# Patient Record
Sex: Male | Born: 1965 | Race: White | Hispanic: No | Marital: Married | Smoking: Former smoker
Health system: Southern US, Community
[De-identification: ages and names within clinical notes are randomized; demographics above are authoritative.]

## PROBLEM LIST (undated history)

## (undated) DIAGNOSIS — I10 Essential (primary) hypertension: Secondary | ICD-10-CM

---

## 2004-10-23 ENCOUNTER — Emergency Department: Payer: Self-pay | Admitting: Internal Medicine

## 2016-11-26 ENCOUNTER — Other Ambulatory Visit (HOSPITAL_COMMUNITY): Payer: Self-pay | Admitting: Physician Assistant

## 2016-11-26 DIAGNOSIS — R05 Cough: Secondary | ICD-10-CM

## 2016-11-26 DIAGNOSIS — R059 Cough, unspecified: Secondary | ICD-10-CM

## 2016-11-26 DIAGNOSIS — R55 Syncope and collapse: Secondary | ICD-10-CM

## 2016-12-19 ENCOUNTER — Telehealth: Payer: Self-pay | Admitting: Radiology

## 2017-05-29 ENCOUNTER — Ambulatory Visit: Payer: BLUE CROSS/BLUE SHIELD | Attending: Neurology

## 2017-05-29 DIAGNOSIS — G4733 Obstructive sleep apnea (adult) (pediatric): Secondary | ICD-10-CM | POA: Insufficient documentation

## 2017-05-29 DIAGNOSIS — R0683 Snoring: Secondary | ICD-10-CM | POA: Insufficient documentation

## 2017-10-05 ENCOUNTER — Emergency Department (HOSPITAL_COMMUNITY): Payer: BLUE CROSS/BLUE SHIELD

## 2017-10-05 ENCOUNTER — Emergency Department (HOSPITAL_COMMUNITY)
Admission: EM | Admit: 2017-10-05 | Discharge: 2017-10-06 | Disposition: A | Discharge status: Home / Self Care | Payer: BLUE CROSS/BLUE SHIELD | Attending: Emergency Medicine | Admitting: Emergency Medicine

## 2017-10-05 ENCOUNTER — Encounter (HOSPITAL_COMMUNITY): Payer: Self-pay | Admitting: *Deleted

## 2017-10-05 DIAGNOSIS — I10 Essential (primary) hypertension: Secondary | ICD-10-CM | POA: Diagnosis not present

## 2017-10-05 DIAGNOSIS — R0789 Other chest pain: Secondary | ICD-10-CM | POA: Insufficient documentation

## 2017-10-05 DIAGNOSIS — Z87891 Personal history of nicotine dependence: Secondary | ICD-10-CM | POA: Insufficient documentation

## 2017-10-05 HISTORY — DX: Essential (primary) hypertension: I10

## 2017-10-05 MED ORDER — GI COCKTAIL ~~LOC~~
30.0000 mL | Freq: Once | ORAL | Status: AC
  Administered 2017-10-06: 30 mL via ORAL
  Filled 2017-10-05: qty 30

## 2017-10-05 NOTE — ED Notes (Signed)
EKG done upon arrival to room. EKG given to and seen by Dr Manus Gunningancour.

## 2017-10-05 NOTE — ED Triage Notes (Signed)
Pt c/o chest tightness that started this am when he woke up; pt describes the pain at a tightness and has some sob with it; pt denies any radiation

## 2017-10-06 ENCOUNTER — Emergency Department (HOSPITAL_COMMUNITY): Payer: BLUE CROSS/BLUE SHIELD

## 2017-10-06 LAB — D-DIMER, QUANTITATIVE (NOT AT ARMC): D DIMER QUANT: 0.66 ug{FEU}/mL — AB (ref 0.00–0.50)

## 2017-10-06 LAB — CBC
HEMATOCRIT: 42.8 % (ref 39.0–52.0)
HEMOGLOBIN: 14.5 g/dL (ref 13.0–17.0)
MCH: 32.4 pg (ref 26.0–34.0)
MCHC: 33.9 g/dL (ref 30.0–36.0)
MCV: 95.7 fL (ref 78.0–100.0)
Platelets: 237 10*3/uL (ref 150–400)
RBC: 4.47 MIL/uL (ref 4.22–5.81)
RDW: 13.3 % (ref 11.5–15.5)
WBC: 6.6 10*3/uL (ref 4.0–10.5)

## 2017-10-06 LAB — BASIC METABOLIC PANEL
ANION GAP: 9 (ref 5–15)
BUN: 16 mg/dL (ref 6–20)
CALCIUM: 9.2 mg/dL (ref 8.9–10.3)
CO2: 25 mmol/L (ref 22–32)
Chloride: 102 mmol/L (ref 101–111)
Creatinine, Ser: 1.1 mg/dL (ref 0.61–1.24)
GFR calc Af Amer: >60 mL/min (ref >60)
GLUCOSE: 113 mg/dL — AB (ref 65–99)
POTASSIUM: 3.9 mmol/L (ref 3.5–5.1)
SODIUM: 136 mmol/L (ref 135–145)

## 2017-10-06 LAB — I-STAT TROPONIN, ED
TROPONIN I, POC: 0 ng/mL (ref 0.00–0.08)
Troponin i, poc: 0 ng/mL (ref 0.00–0.08)

## 2017-10-06 MED ORDER — IOPAMIDOL (ISOVUE-370) INJECTION 76%
100.0000 mL | Freq: Once | INTRAVENOUS | Status: AC | PRN
  Administered 2017-10-06: 100 mL via INTRAVENOUS

## 2017-10-06 MED ORDER — OMEPRAZOLE 20 MG PO CPDR: 20.0000 mg | DELAYED_RELEASE_CAPSULE | Freq: Every day | ORAL | 0 refills | Status: AC

## 2017-10-06 NOTE — Discharge Instructions (Signed)
There is no evidence of heart attack or blood clot in the lung.  Take the stomach medication as prescribed.  Follow-up with the cardiologist for stress test.  Return to the ED if your chest pain becomes worse, is associated with exertion, is associated with shortness of breath, nausea, sweating or any other concerns.

## 2017-10-06 NOTE — ED Notes (Signed)
I-Stat Troponin: 0.00 

## 2017-10-06 NOTE — ED Provider Notes (Signed)
[facility] EMERGENCY DEPARTMENT Provider Note   CSN: [id number] Arrival date & time: 10/05/17  2327     History   Chief Complaint Chief Complaint  Patient presents with  . Chest Pain    HPI Alexander Marks is a 51 y.o. male.  Patient presents with constant left-sided chest tightness that started this morning about 8:30 AM after he ate a bacon and egg sandwich.  He was up for several hours before he had chest tightness.  Tightness has been persistent all day and is constant and does not wax or wane in severity.  Denies any shortness of breath worse than his baseline.  Denies any nausea or vomiting.  Denies radiation of the pain.  Denies any syncope or lightheadedness.  Denies abdominal pain.  Reports no cardiac history.  Takes medication for blood pressure only.  A previous smoker many years ago.  He has never had a stress test.  Reports he had chest tightness like this when he was diagnosed with high blood pressure.   The history is provided by the patient.  Chest Pain   Associated symptoms include shortness of breath. Pertinent negatives include no abdominal pain, no dizziness, no fever, no headaches, no nausea, no vomiting and no weakness.    Past Medical History:  Diagnosis Date  . Hypertension     There are no active problems to display for this patient.   History reviewed. No pertinent surgical history.     Home Medications    Prior to Admission medications   Not on File    Family History History reviewed. No pertinent family history.  Social History Social History  Substance Use Topics  . Smoking status: Former Games developer  . Smokeless tobacco: Current User    Types: Chew  . Alcohol use Yes     Comment: 1-2 beers daily     Allergies   Caffeine   Review of Systems Review of Systems  Constitutional: Negative for activity change, appetite change and fever.  HENT: Negative for congestion.   Eyes: Negative for visual disturbance.  Respiratory:  Positive for chest tightness and shortness of breath.   Cardiovascular: Positive for chest pain.  Gastrointestinal: Negative for abdominal pain, nausea and vomiting.  Genitourinary: Negative for dysuria, hematuria and testicular pain.  Musculoskeletal: Negative for arthralgias and myalgias.  Skin: Negative for rash.  Neurological: Negative for dizziness, weakness and headaches.   all other systems are negative except as noted in the HPI and PMH.     Physical Exam Updated Vital Signs BP (!) 157/105 (BP Location: Left Arm)   Pulse 93   Temp 97.6 F (36.4 C) (Oral)   Resp 17   Ht 5\' 8"  (1.727 m)   Wt 99.3 kg (219 lb)   SpO2 99%   BMI 33.30 kg/m   Physical Exam  Constitutional: He is oriented to person, place, and time. He appears well-developed and well-nourished. No distress.  HENT:  Head: Normocephalic and atraumatic.  Mouth/Throat: Oropharynx is clear and moist. No oropharyngeal exudate.  Eyes: Pupils are equal, round, and reactive to light. Conjunctivae and EOM are normal.  Neck: Normal range of motion. Neck supple.  No meningismus.  Cardiovascular: Normal rate, regular rhythm, normal heart sounds and intact distal pulses.   No murmur heard. Pulmonary/Chest: Effort normal and breath sounds normal. No respiratory distress. He exhibits no tenderness.  Abdominal: Soft. There is no tenderness. There is no rebound and no guarding.  Musculoskeletal: Normal range of motion. He exhibits no edema  or tenderness.  Neurological: He is alert and oriented to person, place, and time. No cranial nerve deficit. He exhibits normal muscle tone. Coordination normal.   5/5 strength throughout. CN 2-12 intact.Equal grip strength.   Skin: Skin is warm.  Psychiatric: He has a normal mood and affect. His behavior is normal.  Nursing note and vitals reviewed.    ED Treatments / Results  Labs (all labs ordered are listed, but only abnormal results are displayed) Labs Reviewed  BASIC METABOLIC  PANEL - Abnormal; Notable for the following:       Result Value   Glucose, Bld 113 (*)    All other components within normal limits  D-DIMER, QUANTITATIVE (NOT AT [facility]) - Abnormal; Notable for the following:    D-Dimer, Quant 0.66 (*)    All other components within normal limits  CBC  I-STAT TROPONIN, ED  I-STAT TROPONIN, ED    EKG  EKG Interpretation  Date/Time:  Monday October 05 2017 23:35:44 EDT Ventricular Rate:  102 PR Interval:    QRS Duration: 89 QT Interval:  362 QTC Calculation: 472 R Axis:   15 Text Interpretation:  Sinus tachycardia Baseline wander in lead(s) I III aVL No previous ECGs available Confirmed by Glynn Octaveancour, Layten Aiken [phone]([id number]) on 10/05/2017 11:46:11 PM       Radiology Dg Chest 2 View  Result Date: 10/06/2017 CLINICAL DATA:  Acute onset of left upper chest pain and shortness of breath. Initial encounter. EXAM: CHEST  2 VIEW COMPARISON:  None. FINDINGS: The lungs are well-aerated and clear. There is no evidence of focal opacification, pleural effusion or pneumothorax. The heart is normal in size; the mediastinal contour is within normal limits. No acute osseous abnormalities are seen. IMPRESSION: No acute cardiopulmonary process seen. Electronically Signed   By: Roanna RaiderJeffery  Chang M.D.   On: 10/06/2017 01:00   Ct Angio Chest Pe W And/or Wo Contrast  Result Date: 10/06/2017 CLINICAL DATA:  51 year old male with shortness of breath and elevated D-dimer. EXAM: CT ANGIOGRAPHY CHEST WITH CONTRAST TECHNIQUE: Multidetector CT imaging of the chest was performed using the standard protocol during bolus administration of intravenous contrast. Multiplanar CT image reconstructions and MIPs were obtained to evaluate the vascular anatomy. CONTRAST:  100 cc Isovue 370 COMPARISON:  Chest radiograph dated 10/06/2017 FINDINGS: Cardiovascular: There is no cardiomegaly or pericardial effusion. The thoracic aorta is unremarkable. The origins of the great vessels of the aortic arch appear  patent. There is suboptimal opacification of the pulmonary arteries due to timing of the contrast. No CT evidence of large or central pulmonary artery embolus. V/Q scan may provide better evaluation if there is high clinical concern for acute PE. Mediastinum/Nodes: There is no hilar or mediastinal adenopathy. The esophagus and thyroid gland are grossly unremarkable. No mediastinal collection. Lungs/Pleura: Lungs are clear. No pleural effusion or pneumothorax. Upper Abdomen: No acute abnormality. Musculoskeletal: No chest wall abnormality. No acute or significant osseous findings. Review of the MIP images confirms the above findings. IMPRESSION: No acute intrathoracic pathology. Suboptimal opacification of the pulmonary arteries due to timing of the contrast. No definite CT evidence of large or central pulmonary artery embolus. V/Q scan may provide better evaluation if there is high clinical concern for acute PE. Electronically Signed   By: Elgie CollardArash  Radparvar M.D.   On: 10/06/2017 01:55    Procedures Procedures (including critical care time)  Medications Ordered in ED Medications  gi cocktail (Maalox,Lidocaine,Donnatal) (not administered)     Initial Impression / Assessment and Plan / ED  Course  I have reviewed the triage vital signs and the nursing notes.  Pertinent labs & imaging results that were available during my care of the patient were reviewed by me and considered in my medical decision making (see chart for details).    Patient with constant chest tightness that has been present since eating breakfast this morning.  Constant all day.  Some shortness of breath.  No nausea or vomiting.  Denies any cardiac history.  EKG is nonischemic.  Chest pain is not reproducible. Labs reassuring with negative troponin.  Heart score is 2.  Low suspicion for ACS.  Pain somewhat improved after GI cocktail. Patient will benefit from stress test as an outpatient.  Troponin negative x2 without evidence  of MI.  CT negative for pulmonary embolism.  Start PPI.  Follow-up with cardiology for stress test.  Return precautions discussed.  Final Clinical Impressions(s) / ED Diagnoses   Final diagnoses:  Atypical chest pain    New Prescriptions New Prescriptions   No medications on file     Glynn Octave, MD 10/06/17 [phone]

## 2018-12-01 IMAGING — CT CT ANGIO CHEST
2 of 6 series · 18 of 46 positions shown · IV contrast (Isovue)
Comparison: Chest radiograph dated 10/06/2017

CLINICAL DATA: 51-year-old male with shortness of breath and
elevated D-dimer.

EXAM:
CT ANGIOGRAPHY CHEST WITH CONTRAST
TECHNIQUE: Multidetector CT imaging of the chest was performed using the
standard protocol during bolus administration of intravenous
contrast. Multiplanar CT image reconstructions and MIPs were
obtained to evaluate the vascular anatomy.
CONTRAST:  100 cc Isovue 370

[Series 6: thins · axial · 0.81mm/px · z∈[-134,+99]mm · 15 of 257 slices shown]
[im 12/257  lung]
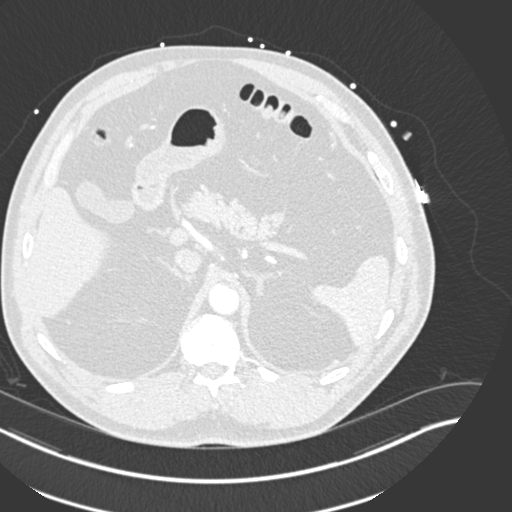
[im 34/257  soft-tissue]
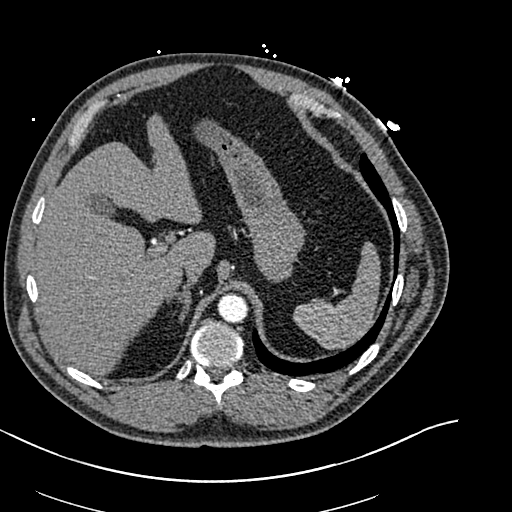
[im 45/257  lung]
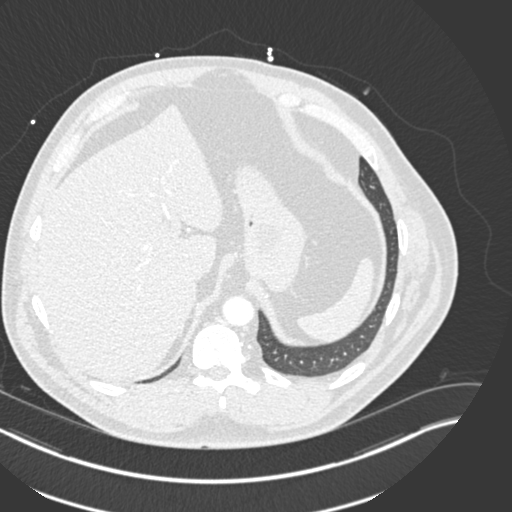
[im 67/257  soft-tissue]
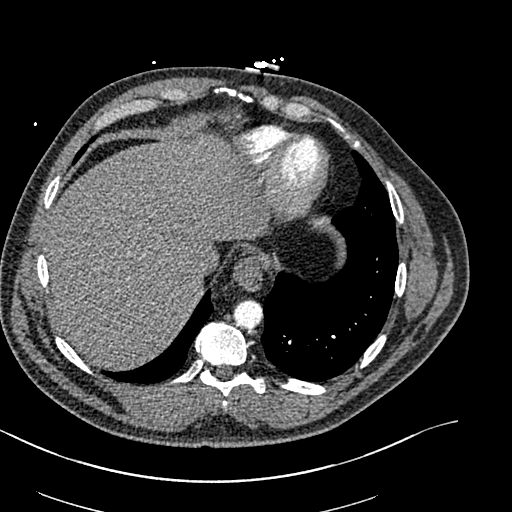
[im 78/257  lung]
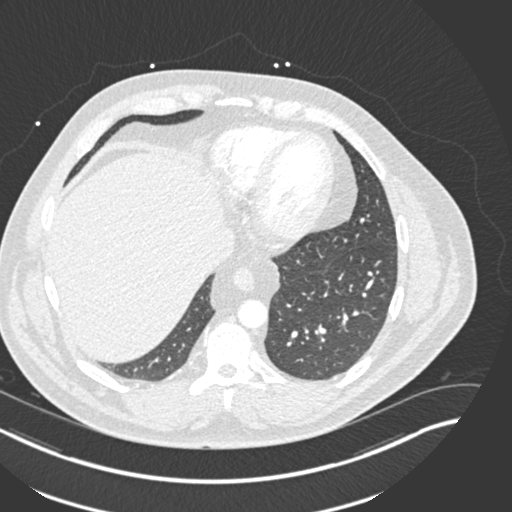
[im 101/257  soft-tissue]
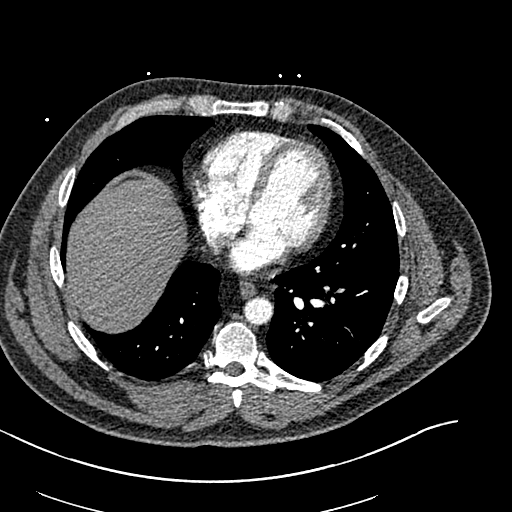
[im 112/257  lung]
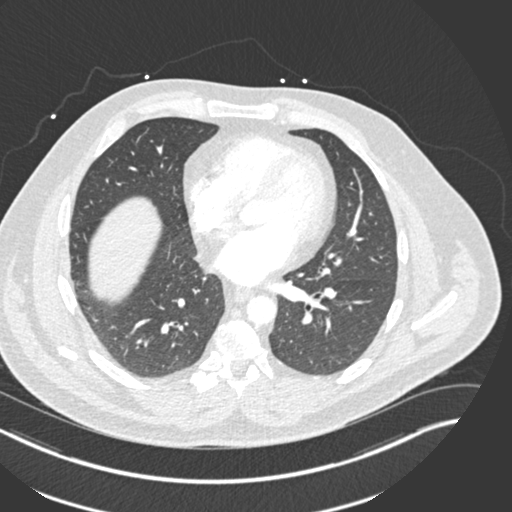
[im 134/257  soft-tissue]
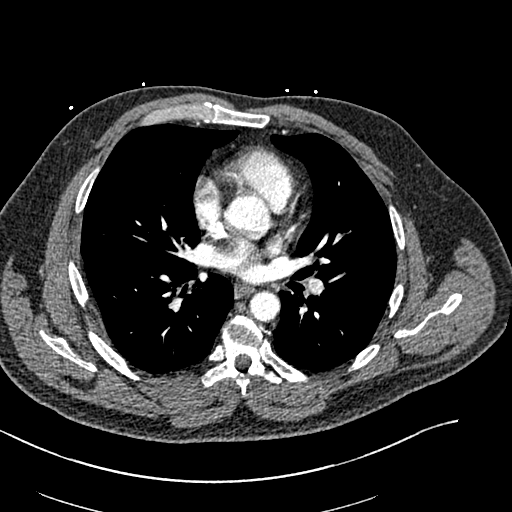
[im 145/257  lung]
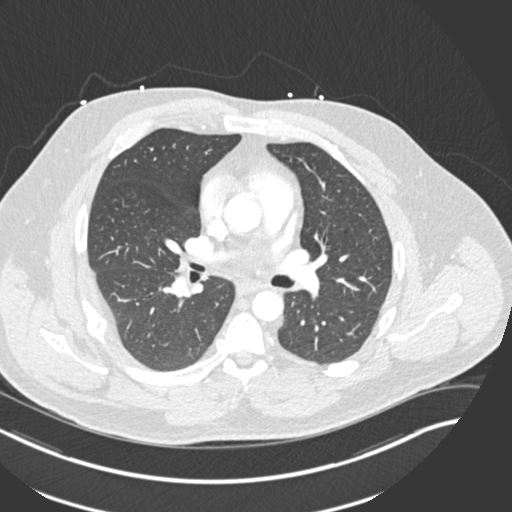
[im 156/257  soft-tissue]
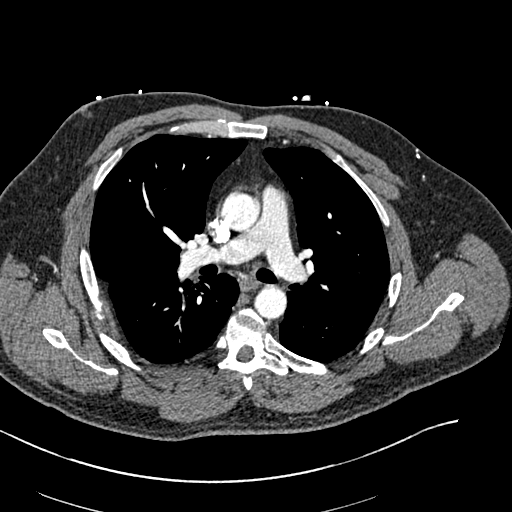
[im 179/257  lung]
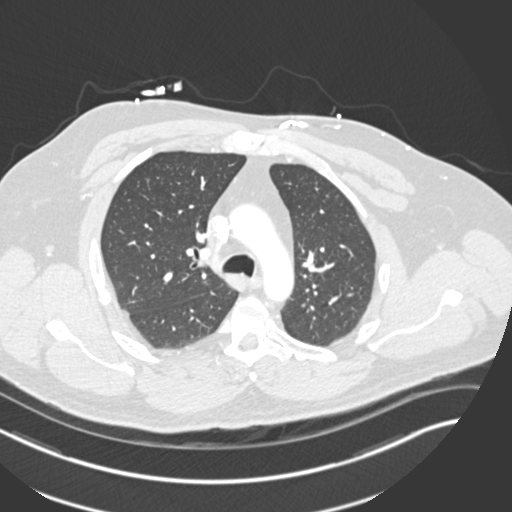
[im 190/257  soft-tissue]
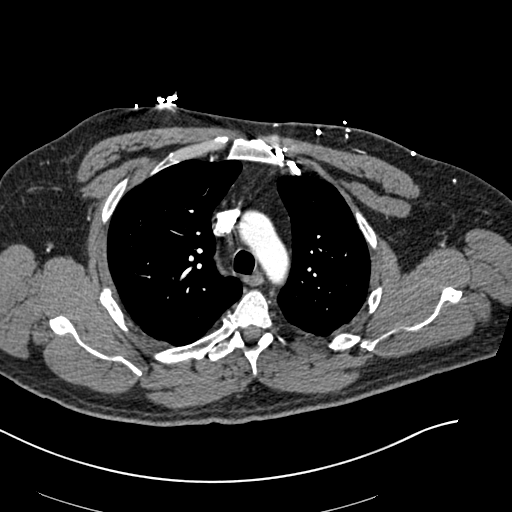
[im 212/257  lung]
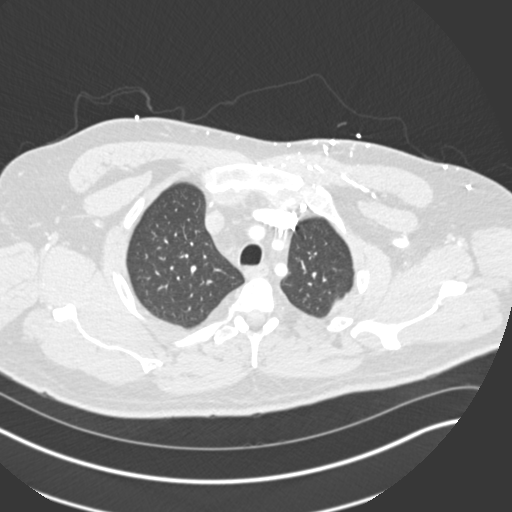
[im 223/257  soft-tissue]
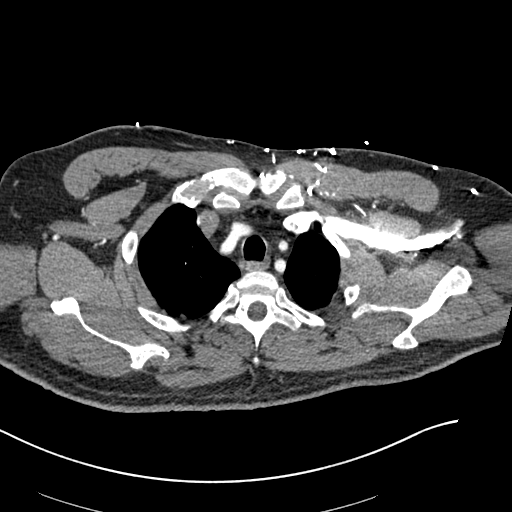
[im 245/257  lung]
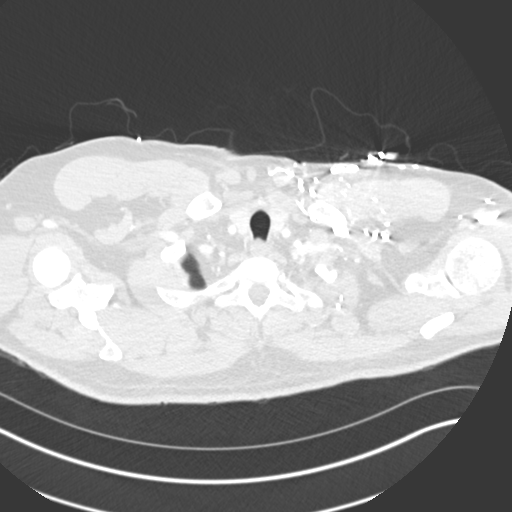

[Series 8: coronal mpr · coronal · 0.52mm/px · 3 of 151 slices shown]
[im 38/151  soft-tissue]
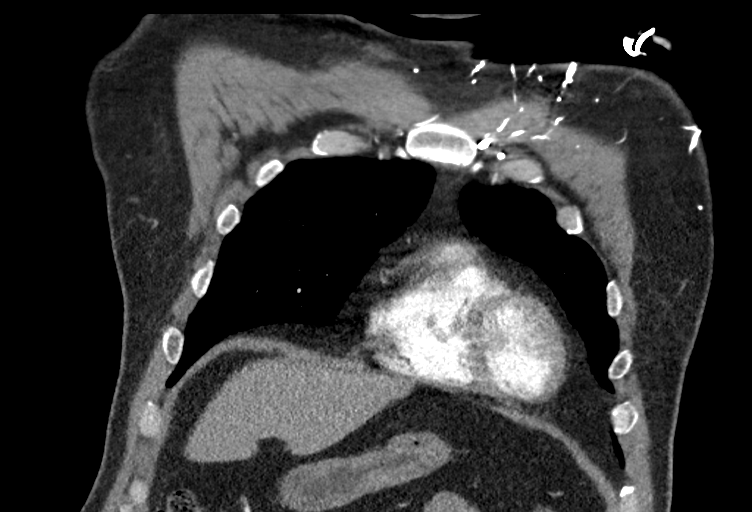
[im 76/151  soft-tissue]
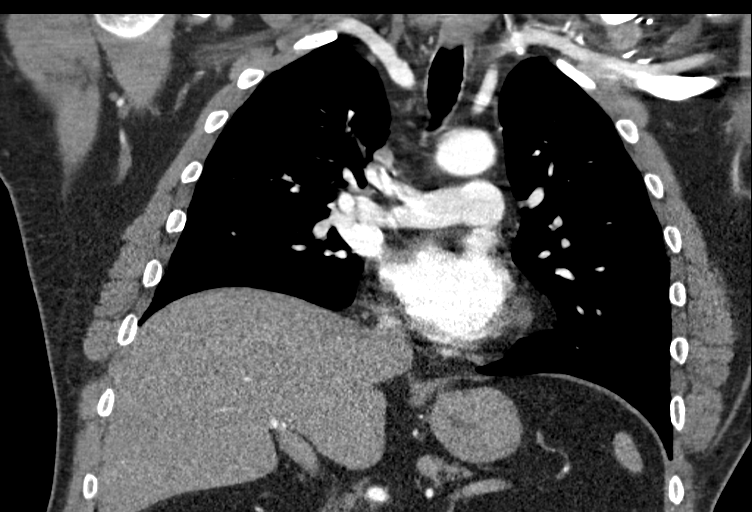
[im 113/151  soft-tissue]
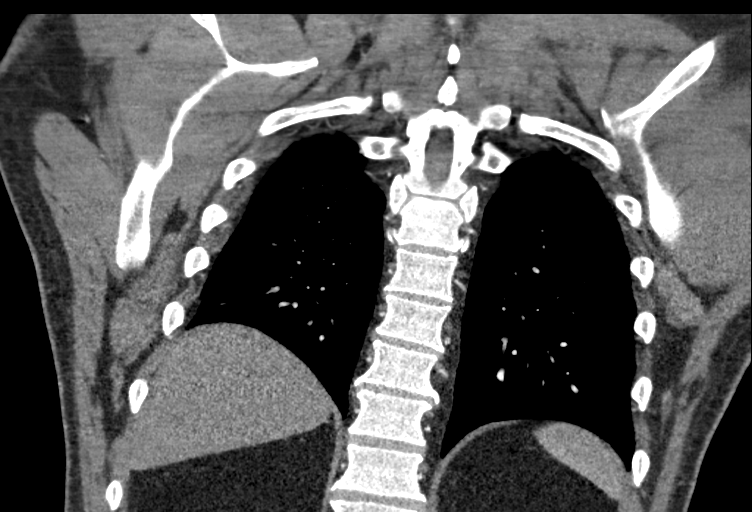

[18 of 46 positions shown; findings below may reference images not displayed]

FINDINGS: Cardiovascular: There is no cardiomegaly or pericardial effusion.
The thoracic aorta is unremarkable. The origins of the great vessels
of the aortic arch appear patent. There is suboptimal opacification
of the pulmonary arteries due to timing of the contrast. No CT
evidence of large or central pulmonary artery embolus. V/Q scan may
provide better evaluation if there is high clinical concern for
acute PE.

Mediastinum/Nodes: There is no hilar or mediastinal adenopathy. The
esophagus and thyroid gland are grossly unremarkable. No mediastinal
collection.

Lungs/Pleura: Lungs are clear. No pleural effusion or pneumothorax.

Upper Abdomen: No acute abnormality.

Musculoskeletal: No chest wall abnormality. No acute or significant
osseous findings.

Review of the MIP images confirms the above findings.
IMPRESSION: No acute intrathoracic pathology. Suboptimal opacification of the
pulmonary arteries due to timing of the contrast. No definite CT
evidence of large or central pulmonary artery embolus. V/Q scan may
provide better evaluation if there is high clinical concern for
acute PE.

## 2022-06-04 ENCOUNTER — Encounter (INDEPENDENT_AMBULATORY_CARE_PROVIDER_SITE_OTHER): Payer: Self-pay | Admitting: *Deleted

## 2022-07-31 ENCOUNTER — Ambulatory Visit (INDEPENDENT_AMBULATORY_CARE_PROVIDER_SITE_OTHER): Payer: BLUE CROSS/BLUE SHIELD | Admitting: Gastroenterology
